# Patient Record
Sex: Female | Born: 1995
Health system: Southern US, Community
[De-identification: ages and names within clinical notes are randomized; demographics above are authoritative.]

## PROBLEM LIST (undated history)

## (undated) HISTORY — PX: FRACTURE SURGERY: SHX138

---
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
## 2016-05-05 DIAGNOSIS — H5213 Myopia, bilateral: Secondary | ICD-10-CM | POA: Diagnosis not present

## 2016-07-21 ENCOUNTER — Ambulatory Visit (INDEPENDENT_AMBULATORY_CARE_PROVIDER_SITE_OTHER): Payer: 59 | Admitting: Family Medicine

## 2016-07-21 ENCOUNTER — Ambulatory Visit (INDEPENDENT_AMBULATORY_CARE_PROVIDER_SITE_OTHER): Payer: 59

## 2016-07-21 DIAGNOSIS — M25532 Pain in left wrist: Secondary | ICD-10-CM | POA: Diagnosis not present

## 2016-07-21 LAB — POCT URINE PREGNANCY: PREG TEST UR: NEGATIVE

## 2016-07-21 MED ORDER — MELOXICAM 7.5 MG PO TABS: 7.5000 mg | ORAL_TABLET | Freq: Every day | ORAL | 0 refills | Status: DC | PRN

## 2016-07-21 MED ORDER — MELOXICAM 7.5 MG PO TABS: 7.5000 mg | ORAL_TABLET | Freq: Every day | ORAL | 0 refills | Status: AC | PRN

## 2016-07-21 NOTE — Patient Instructions (Addendum)
Thank you for coming in today. Take meloxicam daily as needed for pain. Follow-up with an orthopedic or sports medicine doctor if not getting better.  Dr. Terrilee Files at [facility] sports medicine is an option. Additionally I would be happy to see you at the [facility] in [place].  Follow up with me Dr Doc[facility] Address: [address] [place], [place] [address] Phone: [phone]      IF you received an x-ray today, you will receive an invoice from [facility]. Please contact [facility] at [phone] with questions or concerns regarding your invoice.   IF you received labwork today, you will receive an invoice from [employer]. Please contact [facility] at [phone] with questions or concerns regarding your invoice.   Our billing staff will not be able to assist you with questions regarding bills from these companies.  You will be contacted with the lab results as soon as they are available. The fastest way to get your results is to activate your My Chart account. Instructions are located on the last page of this paperwork. If you have not heard from [place] regarding the results in 2 weeks, please contact this office.

## 2016-07-21 NOTE — Progress Notes (Signed)
    Dawn Morales is a 20 y.o. female who presents to [facility] today for wrist pain. Patient has left wrist pain occurring off and on for the last several years. Recently for the last few days the pain has recurred. She denies any injury or changes in activity level. She denies any swelling. Pain is located dorsally and is worse with wrist motion. No radiating pain weakness or numbness fevers or chills. Injury to this wrist. She's had an x-ray of the wrist that was normal 2 years ago.  Patient notes that her last menstrual period was in May. She does not use birth control.   History reviewed. No pertinent past medical history. Past Surgical History:  Procedure Laterality Date  . FRACTURE SURGERY     Social History  Substance Use Topics  . Smoking status: Never Smoker  . Smokeless tobacco: Never Used  . Alcohol use No   ROS as above Medications: Current Outpatient Prescriptions  Medication Sig Dispense Refill  . meloxicam (MOBIC) 7.5 MG tablet Take 1 tablet (7.5 mg total) by mouth daily as needed for pain. 14 tablet 0   No current facility-administered medications for this visit.    No Known Allergies   Exam:  BP 120/70 (BP Location: Left Arm, Patient Position: Sitting, Cuff Size: Small)   Pulse (!) 104   Temp 98.7 F (37.1 C) (Oral)   Resp 18   Ht 5\' 5"  (1.651 m)   Wt 113 lb 9.6 oz (51.5 kg)   LMP 03/27/2016   SpO2 100%   BMI 18.90 kg/m  Gen: Well NAD Left wrist: Normal-appearing. Normal motion however slightly limited by pain. Tender to palpation dorsal radial wrist. No crepitations noted. Grip strength pulses capillary refill and sensation are intact distally bilateral upper extremities.  Results for orders placed or performed in visit on 07/21/16 (from the past 24 hour(s))  POCT urine pregnancy     Status: None   Collection Time: 07/21/16  4:47 PM  Result Value Ref Range   Preg Test, Ur Negative Negative   Dg Wrist Complete Left  Result Date:  07/21/2016 CLINICAL DATA:  Left wrist pain. EXAM: LEFT WRIST - COMPLETE 3+ VIEW COMPARISON:  None. FINDINGS: There is no evidence of fracture or dislocation. There is no evidence of arthropathy or other focal bone abnormality. Soft tissues are unremarkable. IMPRESSION: Negative. Electronically Signed   By: Ted Mcalpine M.D.   On: 07/21/2016 17:13   Assessment and Plan: 20 y.o. female with left wrist pain. Unclear etiology. No good story for acute injury. Possible overuse or some other explanation. Certainly a rheumatologic etiology is a possibility. Plan for trial of meloxicam if not better recommend follow-up with orthopedics or sports medicine.   Discussed warning signs or symptoms. Please see discharge instructions. Patient expresses understanding.

## 2017-03-25 IMAGING — DX DG WRIST COMPLETE 3+V*L*
4 series · 4 of 4 positions shown · non-contrast
Comparison: None.

CLINICAL DATA: Left wrist pain.

EXAM:
LEFT WRIST - COMPLETE 3+ VIEW

[wrist pa]
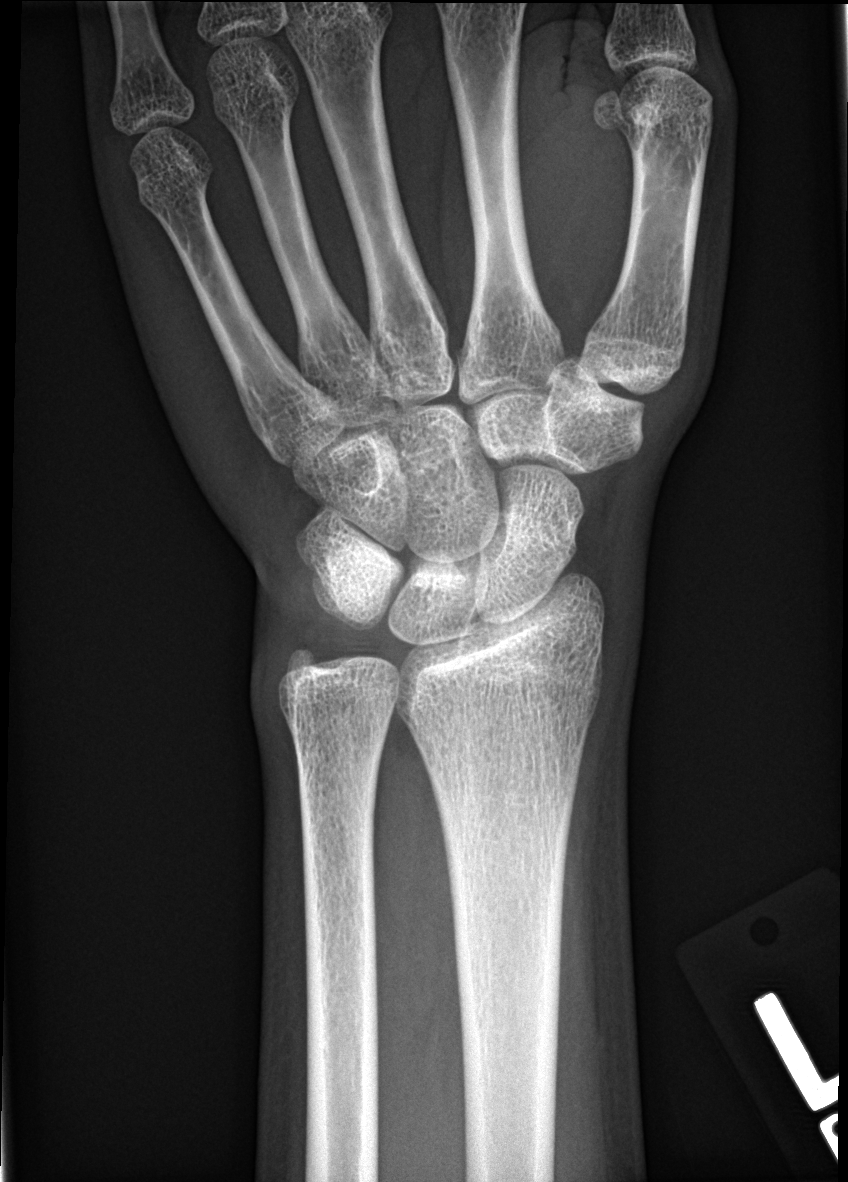

[wrist obl]
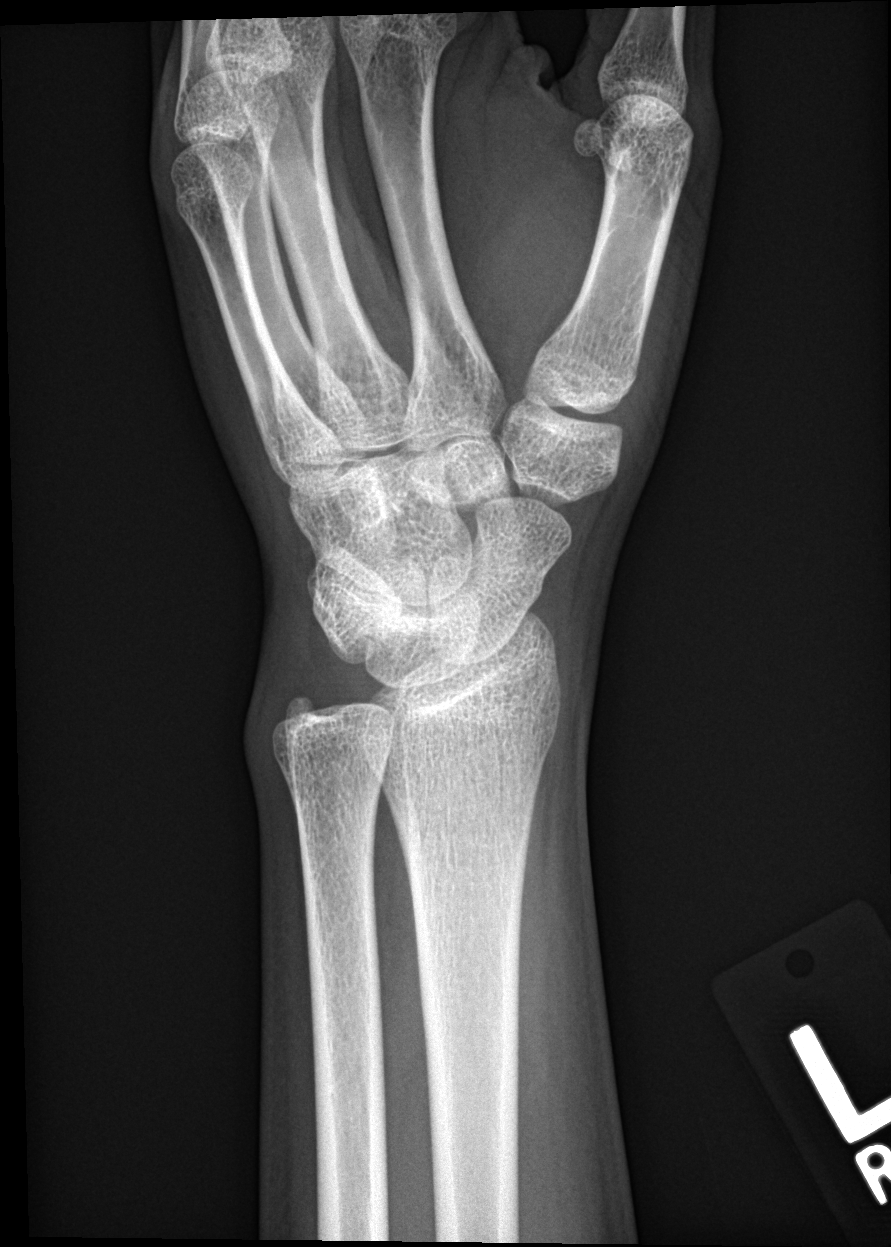

[wrist lat]
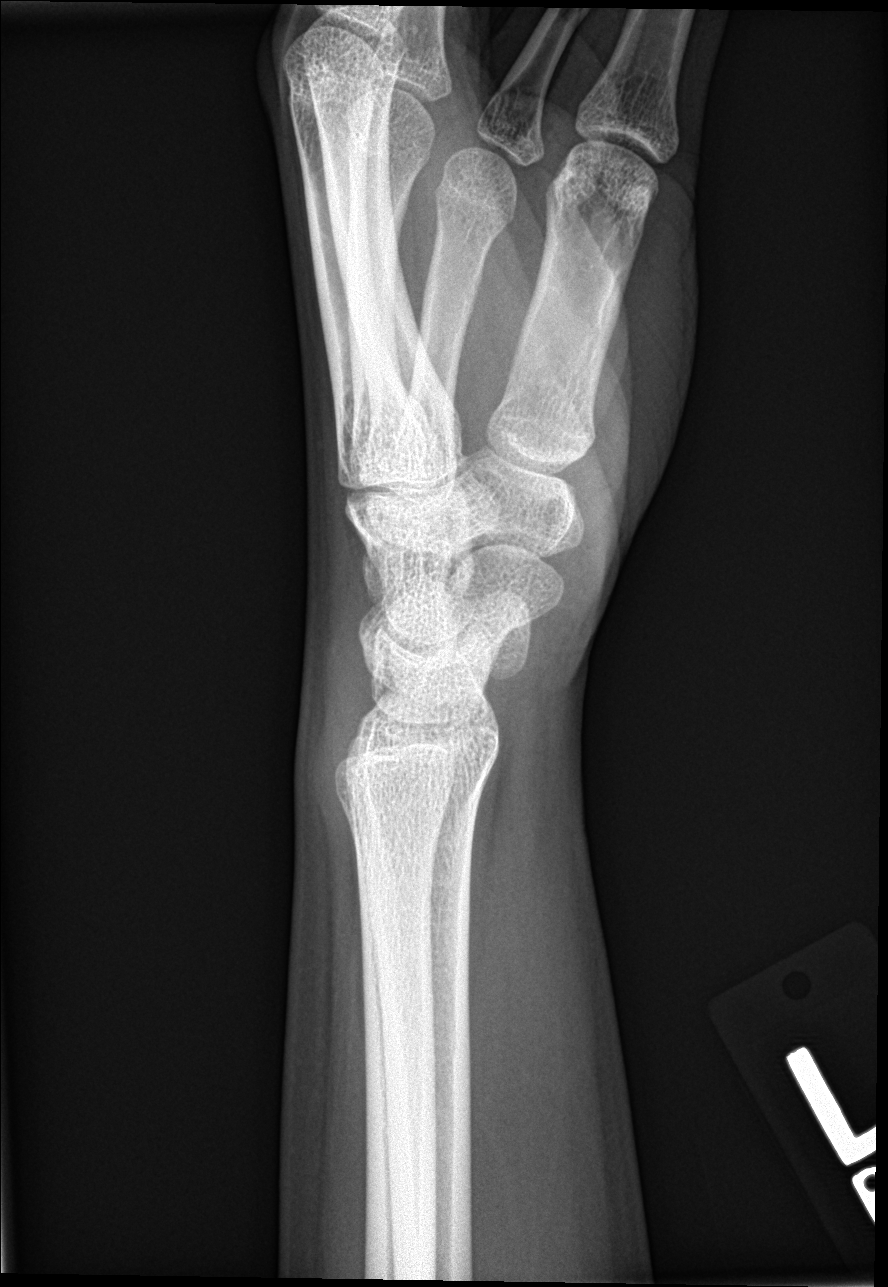

[wrist navicular]
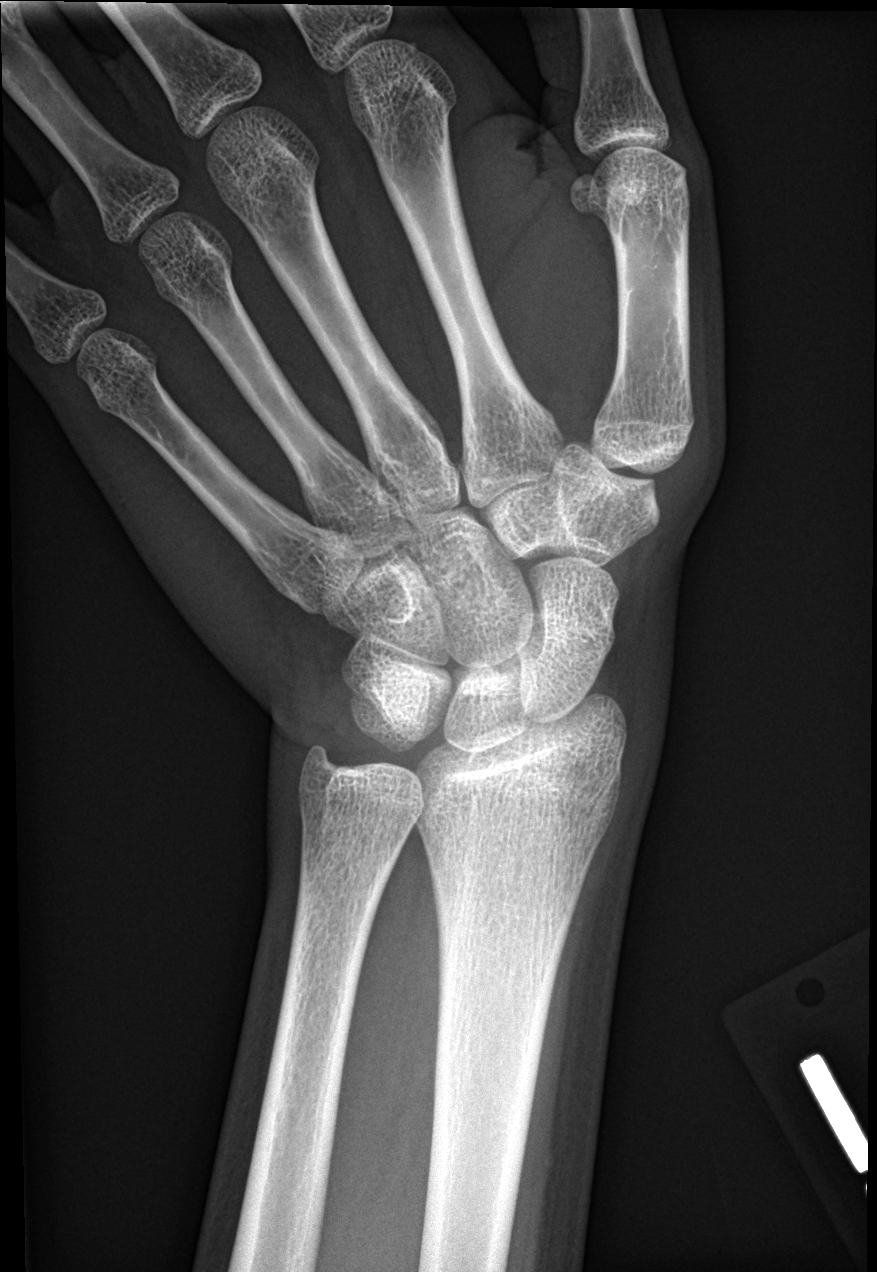

[4 of 4 positions shown; findings below may reference images not displayed]

FINDINGS: There is no evidence of fracture or dislocation. There is no
evidence of arthropathy or other focal bone abnormality. Soft
tissues are unremarkable.
IMPRESSION: Negative.

## 2017-05-29 DIAGNOSIS — H5213 Myopia, bilateral: Secondary | ICD-10-CM | POA: Diagnosis not present

## 2021-08-22 ENCOUNTER — Encounter: Payer: Self-pay | Admitting: Nurse Practitioner

## 2021-08-22 ENCOUNTER — Ambulatory Visit: Payer: 59 | Admitting: Nurse Practitioner

## 2021-08-22 ENCOUNTER — Other Ambulatory Visit: Payer: Self-pay

## 2021-08-22 VITALS — BP 104/62 | HR 111 | Temp 97.7°F | Ht 65.25 in | Wt 118.4 lb

## 2021-08-22 DIAGNOSIS — F4323 Adjustment disorder with mixed anxiety and depressed mood: Secondary | ICD-10-CM | POA: Diagnosis not present

## 2021-08-22 DIAGNOSIS — Z124 Encounter for screening for malignant neoplasm of cervix: Secondary | ICD-10-CM | POA: Diagnosis not present

## 2021-08-22 DIAGNOSIS — Z0000 Encounter for general adult medical examination without abnormal findings: Secondary | ICD-10-CM

## 2021-08-22 LAB — COMPREHENSIVE METABOLIC PANEL
ALT: 13 U/L (ref 0–35)
AST: 16 U/L (ref 0–37)
Albumin: 4.3 g/dL (ref 3.5–5.2)
Alkaline Phosphatase: 68 U/L (ref 39–117)
BUN: 16 mg/dL (ref 6–23)
CO2: 27 mEq/L (ref 19–32)
Calcium: 9.5 mg/dL (ref 8.4–10.5)
Chloride: 102 mEq/L (ref 96–112)
Creatinine, Ser: 0.82 mg/dL (ref 0.40–1.20)
GFR: 100.37 mL/min (ref >60.00)
Glucose, Bld: 76 mg/dL (ref 70–99)
Potassium: 4.2 mEq/L (ref 3.5–5.1)
Sodium: 137 mEq/L (ref 135–145)
Total Bilirubin: 0.4 mg/dL (ref 0.2–1.2)
Total Protein: 7.4 g/dL (ref 6.0–8.3)

## 2021-08-22 LAB — TSH: TSH: 0.98 u[IU]/mL (ref 0.35–5.50)

## 2021-08-22 LAB — CBC WITH DIFFERENTIAL/PLATELET
Basophils Absolute: 0.1 10*3/uL (ref 0.0–0.1)
Basophils Relative: 0.7 % (ref 0.0–3.0)
Eosinophils Absolute: 0.1 10*3/uL (ref 0.0–0.7)
Eosinophils Relative: 1.1 % (ref 0.0–5.0)
HCT: 41.9 % (ref 36.0–46.0)
Hemoglobin: 13.6 g/dL (ref 12.0–15.0)
Lymphocytes Relative: 24.5 % (ref 12.0–46.0)
Lymphs Abs: 1.8 10*3/uL (ref 0.7–4.0)
MCHC: 32.5 g/dL (ref 30.0–36.0)
MCV: 89 fl (ref 78.0–100.0)
Monocytes Absolute: 0.7 10*3/uL (ref 0.1–1.0)
Monocytes Relative: 9.6 % (ref 3.0–12.0)
Neutro Abs: 4.6 10*3/uL (ref 1.4–7.7)
Neutrophils Relative %: 64.1 % (ref 43.0–77.0)
Platelets: 272 10*3/uL (ref 150.0–400.0)
RBC: 4.7 Mil/uL (ref 3.87–5.11)
RDW: 14.7 % (ref 11.5–15.5)
WBC: 7.2 10*3/uL (ref 4.0–10.5)

## 2021-08-22 NOTE — Assessment & Plan Note (Signed)
Onset in middle school due to bullying, no previous use of medication and/or therapy sessions. Hx of self-mutilation while in high school. Today she denies any SI/HI or hallucination. Denies any illicit drug use She lives with her mother and works full-time at [employer], States she avoids socializing with her friends due to depressed mood.  Agreed to psychology referral today Able to provide verbal safety contract today. Agreed to call 911 or inform her mother if any suicide ideation. F/up in 56month

## 2021-08-22 NOTE — Progress Notes (Signed)
Subjective:    Patient ID: Dawn Morales, female    DOB: [birth year], 25 y.o.   MRN: [id number]  Patient presents today for CPE and eval of chronic conditions  Depression        The patient presents with no depression.  This is a chronic problem.  The current episode started more than 1 year ago.   The onset quality is gradual.   The problem occurs daily.  The problem has been gradually worsening since onset.  Associated symptoms include irritable, decreased interest, appetite change and sad.  Associated symptoms include no decreased concentration, no fatigue, no helplessness, no hopelessness, does not have insomnia, no restlessness, no body aches, no myalgias, no headaches, no indigestion and no suicidal ideas.     The symptoms are aggravated by social issues.  Past treatments include nothing.  Risk factors include history of self-injury.   Past medical history includes anxiety.     Pertinent negatives include , no chronic pain, no hypothyroidism, no thyroid problem, no life-threatening condition, no bipolar disorder, no eating disorder, no depression, , no obsessive-compulsive disorder, no post-traumatic stress disorder and no suicide attempts. Adjustment disorder with mixed anxiety and depressed mood Onset in middle school due to bullying, no previous use of medication and/or therapy sessions. Hx of self-mutilation while in high school. Today she denies any SI/HI or hallucination. Denies any illicit drug use She lives with her mother and works full-time at [employer], States she avoids socializing with her friends due to depressed mood.  Agreed to psychology referral today Able to provide verbal safety contract today. Agreed to call 911 or inform her mother if any suicide ideation. F/up in 55month  Vision:up to date Dental:will schedule Diet:regular Exercise:none Weight:  Wt Readings from Last 3 Encounters:  08/22/21 118 lb 6.4 oz (53.7 kg)  07/21/16 113 lb 9.6 oz (51.5 kg) (24 %, Z=  -0.71)*  11/20/14 102 lb 3.2 oz (46.4 kg) (9 %, Z= -1.32)*   * Growth percentiles are based on CDC (Girls, 2-20 Years) data.    Sexual History (orientation,birth control, marital status, STD):sexually active, use of condoms, declines need for STD screen. Request for GYN referral to perform breast and pelvic exam.  Depression/Suicide: Depression screen [facility] 2/9 08/22/2021 07/21/2016  Decreased Interest 2 0  Down, Depressed, Hopeless 2 0  PHQ - 2 Score 4 0  Altered sleeping 3 -  Tired, decreased energy 3 -  Change in appetite 1 -  Feeling bad or failure about yourself  2 -  Trouble concentrating 1 -  Moving slowly or fidgety/restless 1 -  Suicidal thoughts 1 -  PHQ-9 Score 16 -  Difficult doing work/chores Very difficult -   Immunizations: (TDAP, Hep C screen, Pneumovax, Influenza, zoster)  Health Maintenance  Topic Date Due   HPV Vaccine (1 - 2-dose series) Never done   HIV Screening  Never done   Hepatitis C Screening: USPSTF Recommendation to screen - Ages 22-79 yo.  Never done   Tetanus Vaccine  Never done   Pap Smear  Never done   Pap Smear  Never done   Flu Shot  07/25/2021   Pneumococcal Vaccination  Aged Out   Fall Risk: Fall Risk  08/22/2021 07/21/2016  Falls in the past year? - No  Number falls in past yr: 0 -  Injury with Fall? 0 -  Follow up Falls evaluation completed -   Medications and allergies reviewed with patient and updated if appropriate.  Patient Active Problem  List   Diagnosis Date Noted   Adjustment disorder with mixed anxiety and depressed mood 08/22/2021   Left wrist pain 07/21/2016    Current Outpatient Medications on File Prior to Visit  Medication Sig Dispense Refill   meloxicam (MOBIC) 7.5 MG tablet Take 1 tablet (7.5 mg total) by mouth daily as needed for pain. (Patient not taking: Reported on 08/22/2021) 14 tablet 0   No current facility-administered medications on file prior to visit.    History reviewed. No pertinent past medical  history.  Past Surgical History:  Procedure Laterality Date   FRACTURE SURGERY      Social History   Socioeconomic History   Marital status: Single    Spouse name: Not on file   Number of children: Not on file   Years of education: Not on file   Highest education level: Not on file  Occupational History   Not on file  Tobacco Use   Smoking status: Never   Smokeless tobacco: Never  Substance and Sexual Activity   Alcohol use: Yes    Comment: Socially   Drug use: No   Sexual activity: Not on file  Other Topics Concern   Not on file  Social History Narrative   Not on file   Social Determinants of Health   Financial Resource Strain: Not on file  Food Insecurity: Not on file  Transportation Needs: Not on file  Physical Activity: Not on file  Stress: Not on file  Social Connections: Not on file    Family History  Problem Relation Age of Onset   Asthma Mother    Diabetes Mother    Hypertension Father    Anemia Sister    Asthma Brother    Seizures Brother    Seizures Sister         Review of Systems  Constitutional:  Positive for appetite change. Negative for fatigue.  Musculoskeletal:  Negative for myalgias.  Neurological:  Negative for headaches.  Psychiatric/Behavioral:  Positive for depression. Negative for decreased concentration and suicidal ideas. The patient does not have insomnia.    Objective:   Vitals:   08/22/21 1309  BP: 104/62  Pulse: (!) 111  Temp: 97.7 F (36.5 C)  SpO2: 97%   Body mass index is 19.55 kg/m.  Physical Examination:  Physical Exam Vitals reviewed.  Constitutional:      General: She is irritable. She is not in acute distress.    Appearance: She is well-developed.  HENT:     Right Ear: Tympanic membrane, ear canal and external ear normal.     Left Ear: Tympanic membrane, ear canal and external ear normal.  Eyes:     Extraocular Movements: Extraocular movements intact.     Conjunctiva/sclera: Conjunctivae normal.   Cardiovascular:     Rate and Rhythm: Normal rate and regular rhythm.     Pulses: Normal pulses.     Heart sounds: Normal heart sounds.  Pulmonary:     Effort: Pulmonary effort is normal. No respiratory distress.     Breath sounds: Normal breath sounds.  Chest:     Chest wall: No tenderness.  Abdominal:     General: Abdomen is flat. Bowel sounds are normal.     Palpations: Abdomen is soft.  Genitourinary:    Comments: Deferred breast and pelvic exam to GYN Musculoskeletal:        General: Normal range of motion.     Cervical back: Normal range of motion and neck supple.  Right lower leg: No edema.     Left lower leg: No edema.  Skin:    General: Skin is warm and dry.  Neurological:     Mental Status: She is alert and oriented to person, place, and time.     Deep Tendon Reflexes: Reflexes are normal and symmetric.  Psychiatric:        Mood and Affect: Mood normal.        Behavior: Behavior normal.        Thought Content: Thought content normal.    ASSESSMENT and PLAN: This visit occurred during the SARS-CoV-2 public health emergency.  Safety protocols were in place, including screening questions prior to the visit, additional usage of staff PPE, and extensive cleaning of exam room while observing appropriate contact time as indicated for disinfecting solutions.   Zyan was seen today for establish care.  Diagnoses and all orders for this visit:  Preventative health care -     Comprehensive metabolic panel -     CBC with Differential/Platelet -     TSH  Adjustment disorder with mixed anxiety and depressed mood -     Ambulatory referral to Psychology  Cervical cancer screening -     Ambulatory referral to Gynecology Requested immunization records from previous pediatrician.     Problem List Items Addressed This Visit       Other   Adjustment disorder with mixed anxiety and depressed mood    Onset in middle school due to bullying, no previous use of  medication and/or therapy sessions. Hx of self-mutilation while in high school. Today she denies any SI/HI or hallucination. Denies any illicit drug use She lives with her mother and works full-time at [employer], States she avoids socializing with her friends due to depressed mood.  Agreed to psychology referral today Able to provide verbal safety contract today. Agreed to call 911 or inform her mother if any suicide ideation. F/up in 73month      Relevant Orders   Ambulatory referral to Psychology   Other Visit Diagnoses     Preventative health care    -  Primary   Relevant Orders   Comprehensive metabolic panel   CBC with Differential/Platelet   TSH   Cervical cancer screening       Relevant Orders   Ambulatory referral to Gynecology       Follow up: Return in about 4 weeks (around 09/19/2021) for anxiety and depression ( ).  Alysia Penna, NP

## 2021-08-22 NOTE — Patient Instructions (Addendum)
Thank you for choosing [facility]  Go to lab for blood day  Sign medical release form to get your immunization records from previous pediatrician.  You will be contacted to schedule appt with therapist and GYN.  Try Headspace app.  http://[email or web address]"> [email or web address]"> [email or web address]/"> [email or web address]">  Managing Depression, Adult Depression is a mental health condition that affects your thoughts, feelings, and actions. Being diagnosed with depression can bring you relief if you did not know why you have felt or behaved a certain way. It could also leave you feeling overwhelmed with uncertainty about your future. Preparing yourself tomanage your symptoms can help you feel more positive about your future. How to manage lifestyle changes Managing stress  Stress is your body's reaction to life changes and events, both good and bad. Stress can add to your feelings of depression. Learning to manage your stresscan help lessen your feelings of depression. Try some of the following approaches to reducing your stress (stress reduction techniques): Listen to music that you enjoy and that inspires you. Try using a meditation app or take a meditation class. Develop a practice that helps you connect with your spiritual self. Walk in nature, pray, or go to a place of worship. Do some deep breathing. To do this, inhale slowly through your nose. Pause at the top of your inhale for a few seconds and then exhale slowly, letting your muscles relax. Practice yoga to help relax and work your muscles. Choose a stress reduction technique that suits your lifestyle and personality. These techniques take time and practice to develop. Set aside 5-15 minutes a day to do them. Therapists can offer training in these techniques. Other things you can do to manage stress include: Keeping a stress  diary. Knowing your limits and saying no when you think something is too much. Paying attention to how you react to certain situations. You may not be able to control everything, but you can change your reaction. Adding humor to your life by watching funny films or TV shows. Making time for activities that you enjoy and that relax you.  Medicines Medicines, such as antidepressants, are often a part of treatment for depression. Talk with your pharmacist or health care provider about all the medicines, supplements, and herbal products that you take, their possible side effects, and what medicines and other products are safe to take together. Make sure to report any side effects you may have to your health care provider. Relationships Your health care provider may suggest family therapy, couples therapy, orindividual therapy as part of your treatment. How to recognize changes Everyone responds differently to treatment for depression. As you recover from depression, you may start to: Have more interest in doing activities. Feel less hopeless. Have more energy. Overeat less often, or have a better appetite. Have better mental focus. It is important to recognize if your depression is not getting better or is getting worse. The symptoms you had in the beginning may return, such as: Tiredness (fatigue) or low energy. Eating too much or too little. Sleeping too much or too little. Feeling restless, agitated, or hopeless. Trouble focusing or making decisions. Unexplained physical complaints. Feeling irritable, angry, or aggressive. If you or your family members notice these symptoms coming back, let yourhealth care provider know right away. Follow these instructions at home: Activity  Try to get some form of exercise each day, such as walking, biking, swimming, or lifting weights. Practice stress reduction techniques. Engage your mind by taking a class or doing some  volunteer  work.  Lifestyle Get the right amount and quality of sleep. Cut down on using caffeine, tobacco, alcohol, and other potentially harmful substances. Eat a healthy diet that includes plenty of vegetables, fruits, whole grains, low-fat dairy products, and lean protein. Do not eat a lot of foods that are high in solid fats, added sugars, or salt (sodium). General instructions Take over-the-counter and prescription medicines only as told by your health care provider. Keep all follow-up visits as told by your health care provider. This is important. Where to find support Talking to others  Friends and family members can be sources of support and guidance. Talk to trusted friends or family members about your condition. Explain your symptoms to them, and let them know that you are working with a health care provider to treat your depression. Tell friends and family members how they also can behelpful. Finances Find appropriate mental health providers that fit with your financial situation. Talk with your health care provider about options to get reduced prices on your medicines. Where to find more information You can find support in your area from: Anxiety and Depression Association of America (ADAA): www.adaa.org Mental Health America: www.mentalhealthamerica.net [employer] on Mental Illness: www.nami.org Contact a health care provider if: You stop taking your antidepressant medicines, and you have any of these symptoms: Nausea. Headache. Light-headedness. Chills and body aches. Not being able to sleep (insomnia). You or your friends and family think your depression is getting worse. Get help right away if: You have thoughts of hurting yourself or others. If you ever feel like you may hurt yourself or others, or have thoughts about taking your own life, get help right away. Go to your nearest emergency department or: Call your local emergency services (911 in the [place] Call a  suicide crisis helpline, such as the National Suicide Prevention Lifeline at 9134302875. This is open 24 hours a day in the [place] Text the Crisis Text Line at (469)646-8087 (in the [place] Summary If you are diagnosed with depression, preparing yourself to manage your symptoms is a good way to feel positive about your future. Work with your health care provider on a management plan that includes stress reduction techniques, medicines (if applicable), therapy, and healthy lifestyle habits. Keep talking with your health care provider about how your treatment is working. If you have thoughts about taking your own life, call a suicide crisis helpline or text a crisis text line. This information is not intended to replace advice given to you by your health care provider. Make sure you discuss any questions you have with your healthcare provider. Document Revised: 10/22/2019 Document Reviewed: 10/22/2019 Elsevier Patient Education  2022 [employer].

## 2021-09-26 ENCOUNTER — Ambulatory Visit: Payer: 59 | Admitting: Nurse Practitioner

## 2023-08-03 ENCOUNTER — Telehealth: Payer: Self-pay | Admitting: Nurse Practitioner

## 2023-08-03 NOTE — Telephone Encounter (Signed)
Patient has not been seen by current PCP in a year. Reached out to the patient to see if they have a new PCP or if they would like to continue care with the provider.  LVM to schedule
